# Patient Record
Sex: Male | Born: 2000 | Race: White | Hispanic: No | Marital: Single | Smoking: Never smoker
Health system: Southern US, Community
[De-identification: ages and names within clinical notes are randomized; demographics above are authoritative.]

## PROBLEM LIST (undated history)

## (undated) DIAGNOSIS — S3981XA Other specified injuries of abdomen, initial encounter: Secondary | ICD-10-CM

---
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
## 2022-01-25 ENCOUNTER — Encounter: Payer: Self-pay | Admitting: Emergency Medicine

## 2022-01-25 ENCOUNTER — Ambulatory Visit (INDEPENDENT_AMBULATORY_CARE_PROVIDER_SITE_OTHER): Payer: Medicaid Other

## 2022-01-25 ENCOUNTER — Ambulatory Visit
Admission: EM | Admit: 2022-01-25 | Discharge: 2022-01-25 | Disposition: A | Discharge status: Home / Self Care | Payer: Medicaid Other | Attending: Emergency Medicine | Admitting: Emergency Medicine

## 2022-01-25 DIAGNOSIS — S6991XA Unspecified injury of right wrist, hand and finger(s), initial encounter: Secondary | ICD-10-CM

## 2022-01-25 DIAGNOSIS — M25531 Pain in right wrist: Secondary | ICD-10-CM | POA: Diagnosis not present

## 2022-01-25 NOTE — ED Provider Notes (Signed)
Gabriel Sawyer    CSN: [id number] Arrival date & time: 01/25/22  0913      History   Chief Complaint Chief Complaint  Patient presents with   Wrist Injury    HPI Gabriel Sawyer is a 21 y.o. male.  Patient presents with right wrist pain x3 days.  The pain started after he fell while playing basketball and someone landed on his right wrist.  The pain is worse with movement.  Patient has treated this at home with ice packs and ibuprofen.  No OTC medications taken today.  The history is provided by the patient.   History reviewed. No pertinent past medical history.  Patient Active Problem List   Diagnosis Date Noted   Closed head injury with brief loss of consciousness (HCC) 06/18/2018    History reviewed. No pertinent surgical history.     Home Medications    Prior to Admission medications   Medication Sig Start Date End Date Taking? Authorizing Provider  Multiple Vitamins-Minerals (ONE-A-DAY TEEN ADVANTAGE/HIM) TABS Take 1 tablet by mouth daily.   Yes [provider]    Family History History reviewed. No pertinent family history.  Social History Social History   Tobacco Use   Smoking status: Never   Smokeless tobacco: Never  Substance Use Topics   Alcohol use: Never   Drug use: Never     Allergies   Patient has no known allergies.   Review of Systems Review of Systems  Musculoskeletal:  Positive for arthralgias. Negative for joint swelling.  Skin:  Negative for color change, rash and wound.  Neurological:  Negative for weakness and numbness.  All other systems reviewed and are negative.   Physical Exam Triage Vital Signs ED Triage Vitals [01/25/22 0934]  Enc Vitals Group     BP      Pulse      Resp      Temp      Temp src      SpO2      Weight      Height      Head Circumference      Peak Flow      Pain Score 6     Pain Loc      Pain Edu?      Excl. in GC?    No data found.  Updated Vital Signs BP 137/75   Pulse  74   Temp 98.1 F (36.7 C)   Resp 18   SpO2 100%   Visual Acuity Right Eye Distance:   Left Eye Distance:   Bilateral Distance:    Right Eye Near:   Left Eye Near:    Bilateral Near:     Physical Exam Vitals and nursing note reviewed.  Constitutional:      General: He is not in acute distress.    Appearance: Normal appearance. He is well-developed. He is not ill-appearing.  HENT:     Mouth/Throat:     Mouth: Mucous membranes are moist.  Cardiovascular:     Rate and Rhythm: Normal rate and regular rhythm.  Pulmonary:     Effort: Pulmonary effort is normal. No respiratory distress.  Musculoskeletal:        General: Tenderness present. No swelling or deformity. Normal range of motion.       Hands:     Cervical back: Neck supple.  Skin:    General: Skin is warm and dry.     Capillary Refill: Capillary refill takes less than 2  seconds.     Findings: No bruising, erythema, lesion or rash.  Neurological:     General: No focal deficit present.     Mental Status: He is alert and oriented to person, place, and time.     Sensory: No sensory deficit.     Motor: No weakness.  Psychiatric:        Mood and Affect: Mood normal.        Behavior: Behavior normal.     UC Treatments / Results  Labs (all labs ordered are listed, but only abnormal results are displayed) Labs Reviewed - No data to display  EKG   Radiology DG Wrist Complete Right  Result Date: 01/25/2022 CLINICAL DATA:  Pain after trauma 3 days ago. EXAM: RIGHT WRIST - COMPLETE 3+ VIEW COMPARISON:  None Available. FINDINGS: There is no evidence of fracture or dislocation. There is no evidence of arthropathy or other focal bone abnormality. Soft tissues are unremarkable. IMPRESSION: Negative. Electronically Signed   By: Gerome Sam III M.D.   On: 01/25/2022 09:51    Procedures Procedures (including critical care time)  Medications Ordered in UC Medications - No data to display  Initial Impression /  Assessment and Plan / UC Course  I have reviewed the triage vital signs and the nursing notes.  Pertinent labs & imaging results that were available during my care of the patient were reviewed by me and considered in my medical decision making (see chart for details).   Right wrist pain.  Xray negative.  Treating with ibuprofen, rest, elevation, ice packs.  Instructed patient to follow up with Ortho if his symptoms are not improving.  Education provided on wrist pain.  Patient agrees to plan of care.    Final Clinical Impressions(s) / UC Diagnoses   Final diagnoses:  Right wrist pain     Discharge Instructions      Take ibuprofen as needed.  Rest and elevate your wrist.  Apply ice packs 2-3 times a day for up to 20 minutes each.  Follow up with an orthopedist if your symptoms are not improving.        ED Prescriptions   None    PDMP not reviewed this encounter.   Mickie Bail, NP 01/25/22 1005

## 2022-01-25 NOTE — Discharge Instructions (Addendum)
Take ibuprofen as needed.  Rest and elevate your wrist.  Apply ice packs 2-3 times a day for up to 20 minutes each.  Follow up with an orthopedist if your symptoms are not improving.

## 2022-01-25 NOTE — ED Triage Notes (Signed)
Patient c/o RT wrist injury x 3 days ago.   Patient endorses upon onset of symptoms " I was playing basketball when I fell to the ground and someone fell on top of my wrist".   Patient endorses swelling. Patient endorses difficulty with flexion of the RT wrist.   Patient has used ice with no relief of symptoms.

## 2022-04-08 ENCOUNTER — Encounter: Payer: Self-pay | Admitting: Emergency Medicine

## 2022-04-08 ENCOUNTER — Emergency Department: Payer: Medicaid Other

## 2022-04-08 ENCOUNTER — Emergency Department
Admission: EM | Admit: 2022-04-08 | Discharge: 2022-04-08 | Disposition: A | Discharge status: Home / Self Care | Payer: Medicaid Other | Attending: Emergency Medicine | Admitting: Emergency Medicine

## 2022-04-08 ENCOUNTER — Other Ambulatory Visit: Payer: Self-pay

## 2022-04-08 DIAGNOSIS — Y92310 Basketball court as the place of occurrence of the external cause: Secondary | ICD-10-CM | POA: Insufficient documentation

## 2022-04-08 DIAGNOSIS — S0990XA Unspecified injury of head, initial encounter: Secondary | ICD-10-CM | POA: Diagnosis not present

## 2022-04-08 DIAGNOSIS — W01198A Fall on same level from slipping, tripping and stumbling with subsequent striking against other object, initial encounter: Secondary | ICD-10-CM | POA: Diagnosis not present

## 2022-04-08 DIAGNOSIS — S060X0A Concussion without loss of consciousness, initial encounter: Secondary | ICD-10-CM | POA: Diagnosis not present

## 2022-04-08 DIAGNOSIS — Y9367 Activity, basketball: Secondary | ICD-10-CM | POA: Insufficient documentation

## 2022-04-08 NOTE — Discharge Instructions (Addendum)
No work until after Wednesday  Ideally, minimal heavy exertion/physical activity until cleared by your trainer after returning to school  You can take tylenol and advil as needed for pain

## 2022-04-08 NOTE — ED Triage Notes (Signed)
Fell on back Saturday and injured head.  No loc.  Mom concerned about concussion syndrome.  Pt is sensitive to sounds, etc still.

## 2022-04-08 NOTE — ED Provider Notes (Signed)
[facility] Provider Note    Event Date/Time   First MD Initiated Contact with Patient 04/08/22 [phone]     (approximate)   History   Head Injury   HPI  Gabriel Sawyer is a 21 y.o. male here with head injury.  On Saturday, the patient was exercising, when he tried to jump on a an exercise platform.  He fell backwards, over 50 inches in height.  He struck the back of his head.  Reports that he does not believe he lost consciousness but he was "dazed."  He states that he had moderate headache throughout the rest today but did try to go and play basketball, which he noticed that his coordination was off and he actually struck missed a catch and was hit in the head.  Since then, the patient has had persistent headache, some light sensitivity, and noise sensitivity.  He tried to go back to work today and had significant worsening of symptoms so he presents for evaluation.  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  He has a history of 1 prior concussion in the past.  He is not on blood thinners.  He is otherwise healthy.  Denies any vomiting.  No nausea.  No confusion.     Physical Exam   Triage Vital Signs: ED Triage Vitals  Enc Vitals Group     BP 04/08/22 0920 (!) 102/58     Pulse Rate 04/08/22 0920 (!) 52     Resp 04/08/22 0920 17     Temp 04/08/22 0920 98 F (36.7 C)     Temp Source 04/08/22 0920 Oral     SpO2 04/08/22 0920 100 %     Weight 04/08/22 0921 165 lb (74.8 kg)     Height 04/08/22 0921 5\' 10"  (1.778 m)     Head Circumference --      Peak Flow --      Pain Score 04/08/22 0917 4     Pain Loc --      Pain Edu? --      Excl. in GC? --     Most recent vital signs: Vitals:   04/08/22 0920  BP: (!) 102/58  Pulse: (!) 52  Resp: 17  Temp: 98 F (36.7 C)  SpO2: 100%     General: Awake, no distress.  CV:  Good peripheral perfusion.  Regular rate and rhythm. Resp:  Normal effort.  No tenderness. Abd:  No  distention.  Other:  Mild bruising noted to the left eye from a secondary trauma.  No other periorbital ecchymoses.  No postauricular ecchymoses.  Cranial nerves II through XII individually tested and intact.  He is alert and oriented x4.  Strength out of 5 bilateral upper and lower extremities.  Normal sensation to light touch.  Gait normal.  No dysmetria.   ED Results / Procedures / Treatments   Labs (all labs ordered are listed, but only abnormal results are displayed) Labs Reviewed - No data to display   EKG    RADIOLOGY CT head/spine:   I also independently reviewed and agree with radiologist interpretations.   PROCEDURES:  Critical Care performed: No   MEDICATIONS ORDERED IN ED: Medications - No data to display   IMPRESSION / MDM / ASSESSMENT AND PLAN / ED COURSE  I reviewed the triage vital signs and the nursing notes.  The patient is on the cardiac monitor to evaluate for evidence of arrhythmia and/or significant heart rate changes.   Ddx:  Differential includes the following, with pertinent life- or limb-threatening emergencies considered:  Concussion, occult TBI such as skull fracture, subdural, epidural, subarachnoid, pulmonary contusion  Patient's presentation is most consistent with acute complicated illness / injury requiring diagnostic workup.  MDM:  Previously healthy 21 year old male here with persistent headache and light sensitivity after head trauma on Saturday.  Clinically, suspect moderate concussion.  He has a history of prior concussion.  He has no focal neurological deficits.  He is not on anticoagulants.  No family history of easy bruisi\mg or bleeding.  Given his persistent neurological symptoms, CT head obtained and is negative.  He also complained of neck pain so CT neck was obtained and is negative.  Has no upper extremity paresthesias or signs to suggest central cord or occult cord injury.  Will give  supportive care, advised him to stay away from work for now.  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  Return precautions given.   MEDICATIONS GIVEN IN ED: Medications - No data to display   Consults:     EMR reviewed       FINAL CLINICAL IMPRESSION(S) / ED DIAGNOSES   Final diagnoses:  Injury of head, initial encounter  Concussion without loss of consciousness, initial encounter     Rx / DC Orders   ED Discharge Orders     None        Note:  This document was prepared using Dragon voice recognition software and may include unintentional dictation errors.   Shaune Pollack, MD 04/08/22 [phone]

## 2022-04-08 NOTE — ED Notes (Signed)
See triage note  Presents with family s/p fall  hitting head   No LOC   States he has been sensitive to sounds and lights

## 2022-06-08 ENCOUNTER — Ambulatory Visit
Admission: EM | Admit: 2022-06-08 | Discharge: 2022-06-08 | Disposition: A | Discharge status: Home / Self Care | Payer: Medicaid Other | Attending: Urgent Care | Admitting: Urgent Care

## 2022-06-08 DIAGNOSIS — R338 Other retention of urine: Secondary | ICD-10-CM | POA: Diagnosis not present

## 2022-06-08 DIAGNOSIS — N342 Other urethritis: Secondary | ICD-10-CM | POA: Diagnosis not present

## 2022-06-08 HISTORY — DX: Other specified injuries of abdomen, initial encounter: S39.81XA

## 2022-06-08 MED ORDER — DOXYCYCLINE HYCLATE 100 MG PO CAPS: 100.0000 mg | ORAL_CAPSULE | Freq: Two times a day (BID) | ORAL | 0 refills | Status: AC

## 2022-06-08 NOTE — ED Provider Notes (Signed)
Renaldo Fiddler    CSN: [id number] Arrival date & time: 06/08/22  0825      History   Chief Complaint Chief Complaint  Patient presents with   Urinary Retention    HPI Gabriel Sawyer is a 21 y.o. male.   HPI  Patient presents to urgent care with multiple issues:  1.  He reports "urinary retention".  Says his urine flow is abnormally slow.  He believes he is emptying his bladder completely but states he has to go again after a few minutes.  Symptoms x1 month.  2.  He reports episodic erectile dysfunction.  3.  He describes an lesion on the head of his penis that is abnormal.  This was noted by his sexual partner (male).  He is unsure when this first appeared but it was first noticed about 1 month ago.  Endorses past history of "sports hernia" diagnosis in his right groin.]  Endorses 1 sexual partner, monogamous, and no risk of STD.  Uses condoms but not every time.  Denies any STD in his girlfriend.  Denies penile discharge.  Denies burning with urination.  Denies testicle pain.  Past Medical History:  Diagnosis Date   Sports hernia     Patient Active Problem List   Diagnosis Date Noted   Closed head injury with brief loss of consciousness (HCC) 06/18/2018    History reviewed. No pertinent surgical history.     Home Medications    Prior to Admission medications   Medication Sig Start Date End Date Taking? Authorizing Provider  Multiple Vitamins-Minerals (ONE-A-DAY TEEN ADVANTAGE/HIM) TABS Take 1 tablet by mouth daily.    [provider]    Family History History reviewed. No pertinent family history.  Social History Social History   Tobacco Use   Smoking status: Never   Smokeless tobacco: Never  Vaping Use   Vaping Use: Never used  Substance Use Topics   Alcohol use: Never   Drug use: Never     Allergies   Patient has no known allergies.   Review of Systems Review of Systems   Physical Exam Triage Vital Signs ED  Triage Vitals  Enc Vitals Group     BP 06/08/22 0851 119/64     Pulse Rate 06/08/22 0851 62     Resp 06/08/22 0851 18     Temp 06/08/22 0851 98.4 F (36.9 C)     Temp src --      SpO2 06/08/22 0851 98 %     Weight --      Height --      Head Circumference --      Peak Flow --      Pain Score 06/08/22 0852 0     Pain Loc --      Pain Edu? --      Excl. in GC? --    No data found.  Updated Vital Signs BP 119/64   Pulse 62   Temp 98.4 F (36.9 C)   Resp 18   SpO2 98%   Visual Acuity Right Eye Distance:   Left Eye Distance:   Bilateral Distance:    Right Eye Near:   Left Eye Near:    Bilateral Near:     Physical Exam Vitals reviewed.  Constitutional:      Appearance: Normal appearance.  Genitourinary:   Skin:    General: Skin is warm and dry.  Neurological:     General: No focal deficit present.  Mental Status: He is alert and oriented to person, place, and time.  Psychiatric:        Mood and Affect: Mood normal.        Behavior: Behavior normal.      UC Treatments / Results  Labs (all labs ordered are listed, but only abnormal results are displayed) Labs Reviewed - No data to display  EKG   Radiology No results found.  Procedures Procedures (including critical care time)  Medications Ordered in UC Medications - No data to display  Initial Impression / Assessment and Plan / UC Course  I have reviewed the triage vital signs and the nursing notes.  Pertinent labs & imaging results that were available during my care of the patient were reviewed by me and considered in my medical decision making (see chart for details).   1.  Symptoms suggest urethritis and will treat with antibiotic for the most common cause.  Obtaining penile swab.  Recommending follow-up with urology if symptoms do not resolve with treatment. 2.  This is possibly related to #1.  Recommending follow-up with urology if symptoms do not resolve with treatment. 3.  Lesion  resembles lichen planus.  Recommend follow-up with dermatology.   Final Clinical Impressions(s) / UC Diagnoses   Final diagnoses:  None   Discharge Instructions   None    ED Prescriptions   None    PDMP not reviewed this encounter.   Rose Phi, [place] 06/08/22 [phone]

## 2022-06-08 NOTE — ED Triage Notes (Signed)
Pt states for the past month he has been experiencing urinary retention and erectile erectile dysfunction. Pt. Also c/o abnormal appearence on the head of his penis.  Pt. States he was previously diagnosed w/ a sports hernia in his right groin site last week.

## 2022-06-08 NOTE — Discharge Instructions (Addendum)
You are being treated today for inflammation of your urethra.  Please take the antibiotic twice a day for 1 week as directed.  Take it with food to avoid stomach irritation.  If your symptoms do not resolve, I recommend follow-up with a urologist.  The results of the swab we took today will be communicated to you by MyChart.  If positive, someone will contact you to discuss treatment.     Follow-up with dermatology regarding the skin lesion.

## 2022-06-10 LAB — CYTOLOGY, (ORAL, ANAL, URETHRAL) ANCILLARY ONLY
Chlamydia: NEGATIVE
Comment: NEGATIVE
Comment: NEGATIVE
Comment: NORMAL
Neisseria Gonorrhea: NEGATIVE
Trichomonas: NEGATIVE

## 2023-03-05 IMAGING — DX DG WRIST COMPLETE 3+V*R*
4 series · 4 of 4 positions shown · non-contrast
Comparison: None Available.

CLINICAL DATA: Pain after trauma 3 days ago.

EXAM:
RIGHT WRIST - COMPLETE 3+ VIEW

[wrist pa (1 of 2)]
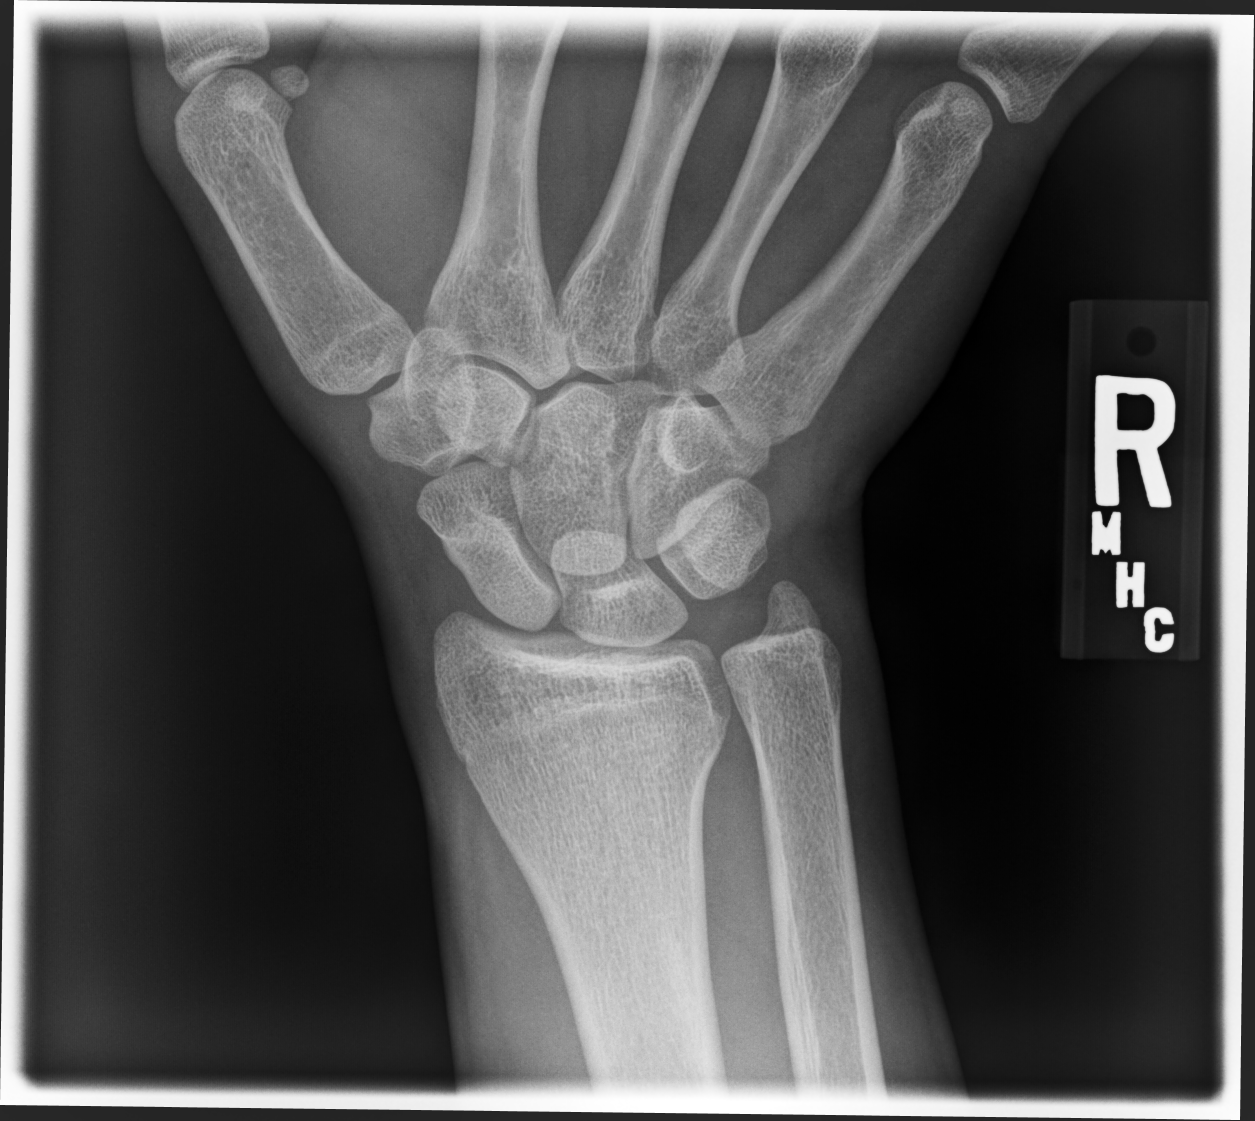

[wrist pa (2 of 2)]
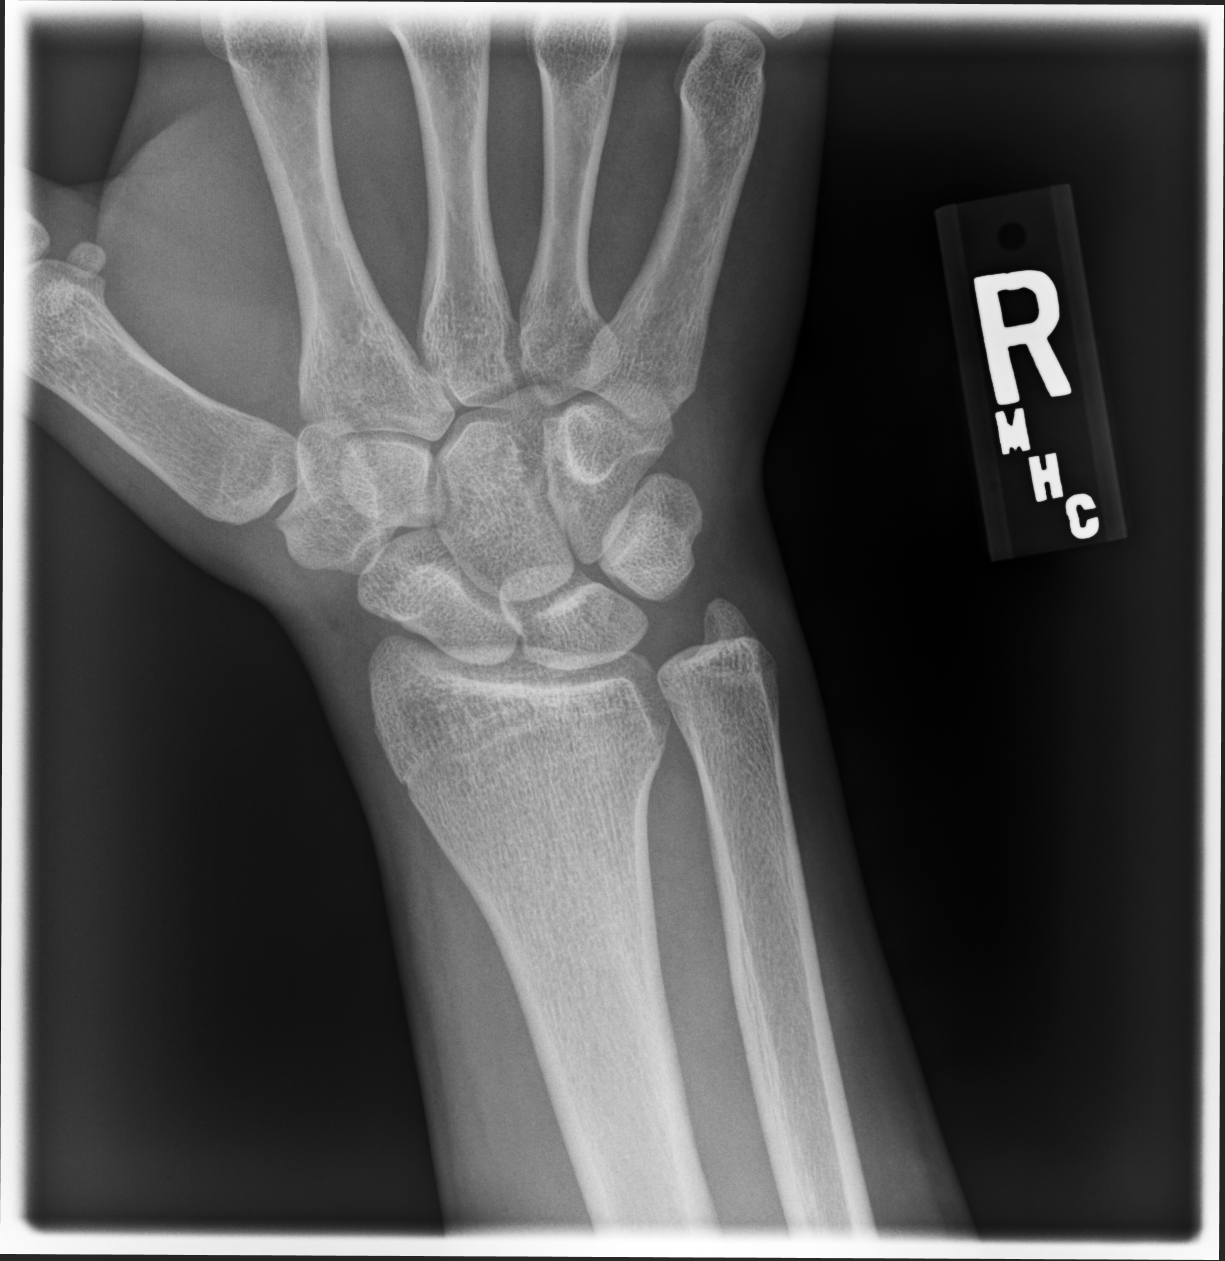

[wrist mlo]
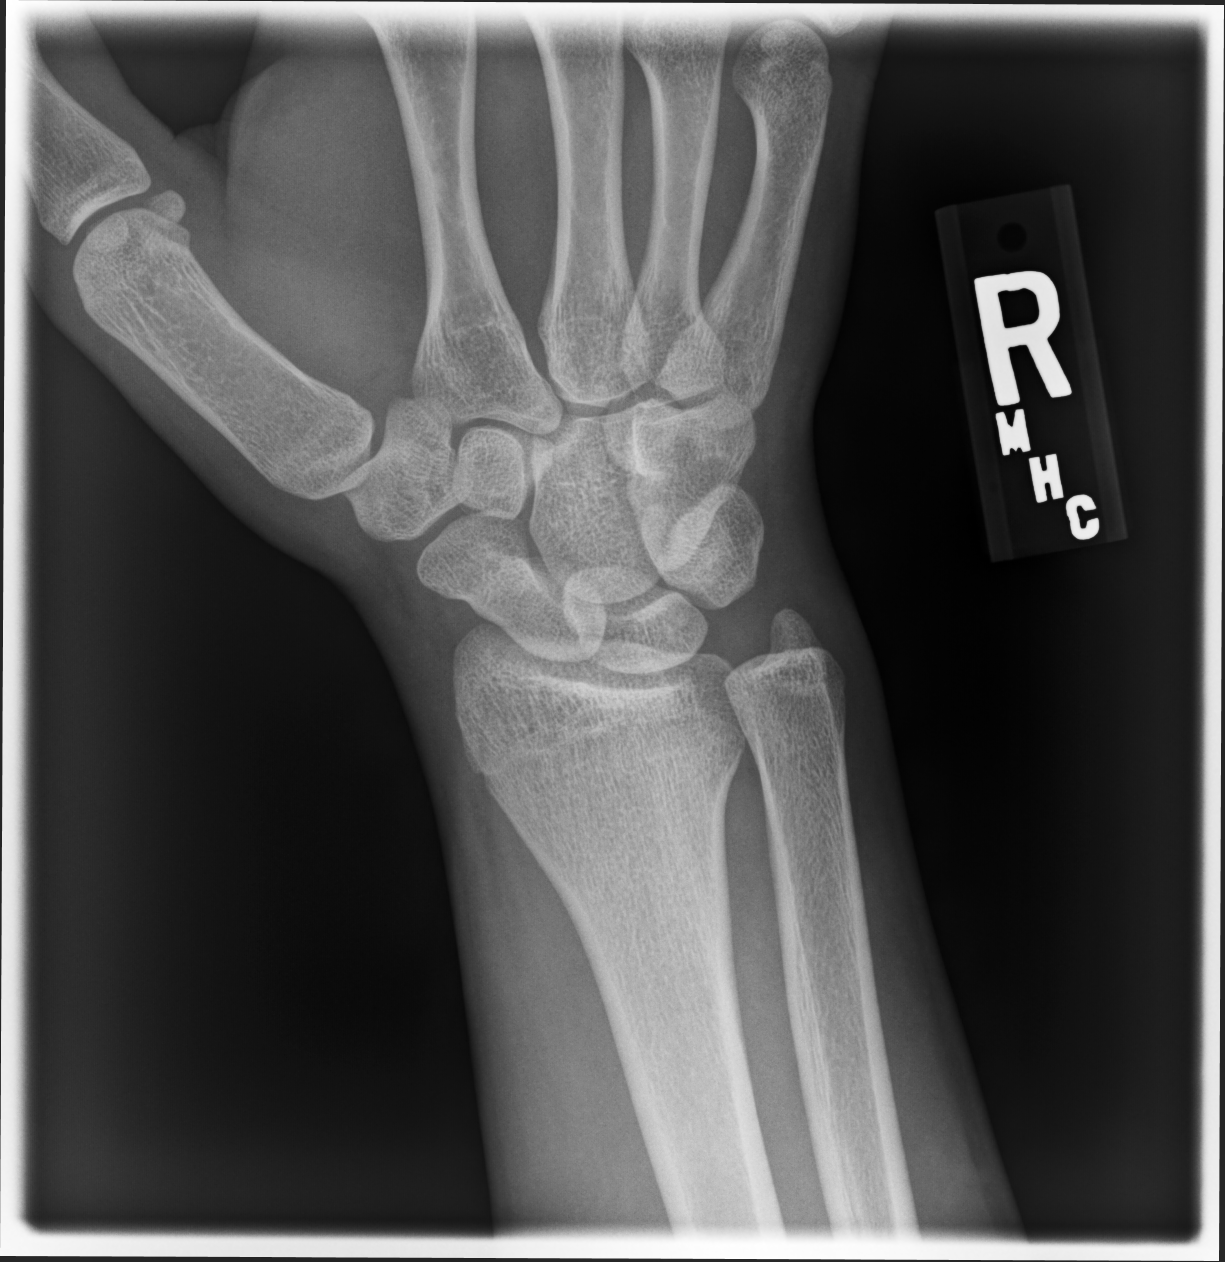

[wrist lat]
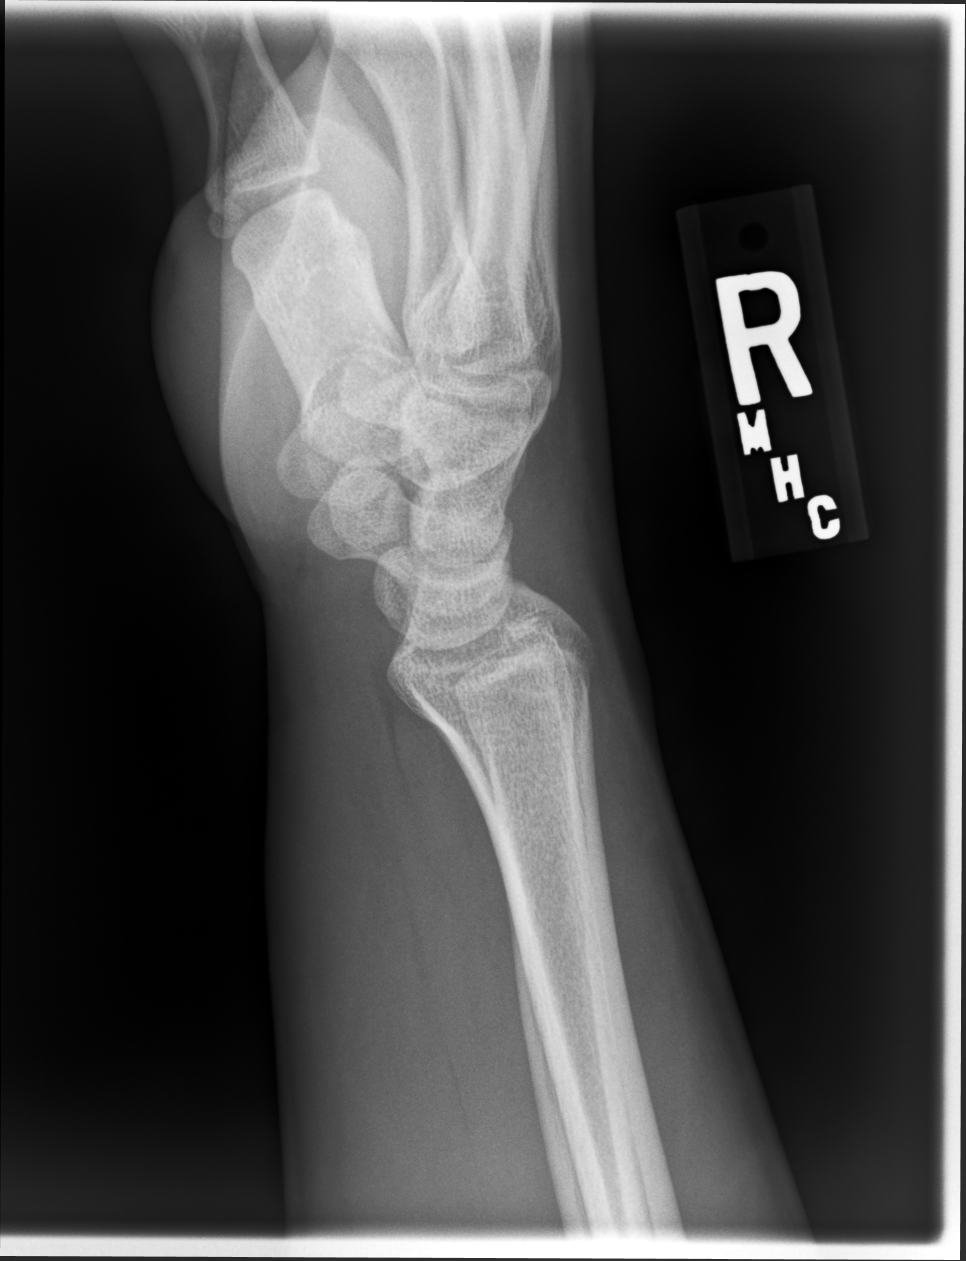

[4 of 4 positions shown; findings below may reference images not displayed]

FINDINGS: There is no evidence of fracture or dislocation. There is no
evidence of arthropathy or other focal bone abnormality. Soft
tissues are unremarkable.
IMPRESSION: Negative.

## 2023-08-05 ENCOUNTER — Encounter: Payer: Self-pay | Admitting: Dermatology

## 2023-08-05 ENCOUNTER — Ambulatory Visit: Payer: Medicaid Other | Admitting: Dermatology

## 2023-08-05 VITALS — BP 115/66 | HR 72

## 2023-08-05 DIAGNOSIS — D492 Neoplasm of unspecified behavior of bone, soft tissue, and skin: Secondary | ICD-10-CM | POA: Diagnosis not present

## 2023-08-05 NOTE — Patient Instructions (Signed)

## 2023-08-05 NOTE — Progress Notes (Addendum)
   New Patient Visit   Subjective  Gabriel Sawyer is a 22 y.o. male who presents for the following: spot on left side of foot Pt has a spot on his foot for years that has changed. There is family hx of abnormal moles but no personal hx.  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  The following portions of the chart were reviewed this encounter and updated as appropriate: medications, allergies, medical history  Review of Systems:  No other skin or systemic complaints except as noted in HPI or Assessment and Plan.  Objective  Well appearing patient in no apparent distress; mood and affect are within normal limits.   A focused examination was performed of the following areas: Side of left foot  Relevant exam findings are noted in the Assessment and Plan.  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  Return in about 3 months (around 11/05/2023) for spot check.  I, Tillie Fantasia, CMA, am acting as scribe for Gwenith Daily, MD.   Documentation: I have reviewed the above documentation for accuracy and completeness, and I agree with the above.  Gwenith Daily, MD

## 2023-09-12 ENCOUNTER — Emergency Department (HOSPITAL_COMMUNITY)
Admission: EM | Admit: 2023-09-12 | Discharge: 2023-09-12 | Disposition: A | Discharge status: Home / Self Care | Payer: Medicaid Other | Attending: Emergency Medicine | Admitting: Emergency Medicine

## 2023-09-12 ENCOUNTER — Other Ambulatory Visit: Payer: Self-pay

## 2023-09-12 ENCOUNTER — Emergency Department (HOSPITAL_COMMUNITY): Payer: Medicaid Other

## 2023-09-12 DIAGNOSIS — S3991XA Unspecified injury of abdomen, initial encounter: Secondary | ICD-10-CM | POA: Diagnosis present

## 2023-09-12 DIAGNOSIS — D72829 Elevated white blood cell count, unspecified: Secondary | ICD-10-CM | POA: Insufficient documentation

## 2023-09-12 DIAGNOSIS — R112 Nausea with vomiting, unspecified: Secondary | ICD-10-CM

## 2023-09-12 DIAGNOSIS — W208XXA Other cause of strike by thrown, projected or falling object, initial encounter: Secondary | ICD-10-CM | POA: Insufficient documentation

## 2023-09-12 LAB — COMPREHENSIVE METABOLIC PANEL
ALT: 20 U/L (ref 0–44)
AST: 37 U/L (ref 15–41)
Albumin: 4.5 g/dL (ref 3.5–5.0)
Alkaline Phosphatase: 52 U/L (ref 38–126)
Anion gap: 14 (ref 5–15)
BUN: 11 mg/dL (ref 6–20)
CO2: 19 mmol/L — ABNORMAL LOW (ref 22–32)
Calcium: 9.6 mg/dL (ref 8.9–10.3)
Chloride: 105 mmol/L (ref 98–111)
Creatinine, Ser: 1.05 mg/dL (ref 0.61–1.24)
GFR, Estimated: >60 mL/min (ref >60)
Glucose, Bld: 122 mg/dL — ABNORMAL HIGH (ref 70–99)
Potassium: 3.6 mmol/L (ref 3.5–5.1)
Sodium: 138 mmol/L (ref 135–145)
Total Bilirubin: 1.3 mg/dL — ABNORMAL HIGH (ref 0.0–1.2)
Total Protein: 7.4 g/dL (ref 6.5–8.1)

## 2023-09-12 LAB — CBC WITH DIFFERENTIAL/PLATELET
Abs Immature Granulocytes: 0.07 10*3/uL (ref 0.00–0.07)
Basophils Absolute: 0 10*3/uL (ref 0.0–0.1)
Basophils Relative: 0 %
Eosinophils Absolute: 0 10*3/uL (ref 0.0–0.5)
Eosinophils Relative: 0 %
HCT: 52.4 % — ABNORMAL HIGH (ref 39.0–52.0)
Hemoglobin: 17.6 g/dL — ABNORMAL HIGH (ref 13.0–17.0)
Immature Granulocytes: 0 %
Lymphocytes Relative: 2 %
Lymphs Abs: 0.4 10*3/uL — ABNORMAL LOW (ref 0.7–4.0)
MCH: 31.5 pg (ref 26.0–34.0)
MCHC: 33.6 g/dL (ref 30.0–36.0)
MCV: 93.9 fL (ref 80.0–100.0)
Monocytes Absolute: 1 10*3/uL (ref 0.1–1.0)
Monocytes Relative: 5 %
Neutro Abs: 16.7 10*3/uL — ABNORMAL HIGH (ref 1.7–7.7)
Neutrophils Relative %: 93 %
Platelets: 178 10*3/uL (ref 150–400)
RBC: 5.58 MIL/uL (ref 4.22–5.81)
RDW: 12.5 % (ref 11.5–15.5)
WBC: 18.2 10*3/uL — ABNORMAL HIGH (ref 4.0–10.5)
nRBC: 0 % (ref 0.0–0.2)

## 2023-09-12 LAB — LIPASE, BLOOD: Lipase: 28 U/L (ref 11–51)

## 2023-09-12 MED ORDER — MORPHINE SULFATE (PF) 4 MG/ML IV SOLN
4.0000 mg | Freq: Once | INTRAVENOUS | Status: AC
  Administered 2023-09-12: 4 mg via INTRAVENOUS
  Filled 2023-09-12: qty 1

## 2023-09-12 MED ORDER — ONDANSETRON HCL 4 MG PO TABS: 4.0000 mg | ORAL_TABLET | Freq: Four times a day (QID) | ORAL | 0 refills | Status: AC

## 2023-09-12 MED ORDER — SODIUM CHLORIDE 0.9 % IV BOLUS
1000.0000 mL | Freq: Once | INTRAVENOUS | Status: AC
  Administered 2023-09-12: 1000 mL via INTRAVENOUS

## 2023-09-12 MED ORDER — ONDANSETRON HCL 4 MG/2ML IJ SOLN
4.0000 mg | Freq: Once | INTRAMUSCULAR | Status: AC
Start: 2023-09-12 — End: 2023-09-12
  Administered 2023-09-12: 4 mg via INTRAVENOUS
  Filled 2023-09-12: qty 2

## 2023-09-12 MED ORDER — ONDANSETRON HCL 4 MG/2ML IJ SOLN
4.0000 mg | Freq: Once | INTRAMUSCULAR | Status: AC
  Administered 2023-09-12: 4 mg via INTRAVENOUS
  Filled 2023-09-12: qty 2

## 2023-09-12 MED ORDER — KETOROLAC TROMETHAMINE 30 MG/ML IJ SOLN
15.0000 mg | Freq: Once | INTRAMUSCULAR | Status: AC
  Administered 2023-09-12: 15 mg via INTRAVENOUS
  Filled 2023-09-12: qty 1

## 2023-09-12 MED ORDER — IOHEXOL 350 MG/ML SOLN
75.0000 mL | Freq: Once | INTRAVENOUS | Status: AC | PRN
  Administered 2023-09-12: 75 mL via INTRAVENOUS

## 2023-09-12 NOTE — ED Notes (Signed)
 Pt ambulatory to restroom with help of family member

## 2023-09-12 NOTE — ED Triage Notes (Signed)
 Patient via [facility] from home c/o dropping 135 lb weight on his abdomen @1300 . Pain and bruise noted around 1400 umbilicus that radaites to LUQ of abdomen. Reports n/v x 4 hours. Has urinated since this injury; no hematuria noted.  Vss.

## 2023-09-12 NOTE — ED Notes (Signed)
 Patient desatting to 76% s/p morphine - patient placed on 2L  and instructed to take deep breaths. Dr. Elayne Snare made aware.

## 2023-09-12 NOTE — ED Provider Notes (Signed)
 [facility] Provider Note   CSN: [id number] Arrival date & time: 09/12/23  1942     History  Chief Complaint  Patient presents with   Abdominal Injury    Gabriel Sawyer is a 23 y.o. male.  Who is otherwise healthy presenting for abdominal injury.  Patient was lifting weights (benchpress) when he accidentally dropped a 135 pound barbell across the middle of his abdomen.  He has had significant abdominal pain since then and vomited several times at home prior to arrival.  He has urinated since the injury without issue.  No other injuries or complaints this time.  HPI     Home Medications Prior to Admission medications   Medication Sig Start Date End Date Taking? Authorizing Provider  ondansetron  (ZOFRAN ) 4 MG tablet Take 1 tablet (4 mg total) by mouth every 6 (six) hours. 09/12/23  Yes Pamella Ozell LABOR, DO  Multiple Vitamins-Minerals (ONE-A-DAY TEEN ADVANTAGE/HIM) TABS Take 1 tablet by mouth daily.    [provider]      Allergies    Patient has no known allergies.    Review of Systems   Review of Systems  Physical Exam Updated Vital Signs BP 122/76   Pulse 80   Temp 97.9 F (36.6 C) (Oral)   Resp 19   SpO2 100%  Physical Exam Vitals and nursing note reviewed.  HENT:     Head: Normocephalic and atraumatic.  Eyes:     Pupils: Pupils are equal, round, and reactive to light.  Cardiovascular:     Rate and Rhythm: Normal rate and regular rhythm.  Pulmonary:     Effort: Pulmonary effort is normal.     Breath sounds: Normal breath sounds.  Abdominal:     Palpations: Abdomen is soft.     Tenderness: There is abdominal tenderness.     Comments: Diffuse abdominal tenderness with guarding  Skin:    General: Skin is warm and dry.  Neurological:     Mental Status: He is alert.  Psychiatric:        Mood and Affect: Mood normal.     ED Results / Procedures / Treatments   Labs (all labs ordered are listed, but only  abnormal results are displayed) Labs Reviewed  COMPREHENSIVE METABOLIC PANEL - Abnormal; Notable for the following components:      Result Value   CO2 19 (*)    Glucose, Bld 122 (*)    Total Bilirubin 1.3 (*)    All other components within normal limits  CBC WITH DIFFERENTIAL/PLATELET - Abnormal; Notable for the following components:   WBC 18.2 (*)    Hemoglobin 17.6 (*)    HCT 52.4 (*)    Neutro Abs 16.7 (*)    Lymphs Abs 0.4 (*)    All other components within normal limits  LIPASE, BLOOD    EKG None  Radiology CT ABDOMEN PELVIS W CONTRAST Result Date: 09/12/2023 CLINICAL DATA:  Abdominal trauma EXAM: CT ABDOMEN AND PELVIS WITH CONTRAST TECHNIQUE: Multidetector CT imaging of the abdomen and pelvis was performed using the standard protocol following bolus administration of intravenous contrast. RADIATION DOSE REDUCTION: This exam was performed according to the departmental dose-optimization program which includes automated exposure control, adjustment of the mA and/or kV according to patient size and/or use of iterative reconstruction technique. CONTRAST:  75mL OMNIPAQUE  IOHEXOL  350 MG/ML SOLN COMPARISON:  None Available. FINDINGS: Lower chest: No acute abnormality. Hepatobiliary: No hepatic injury or perihepatic hematoma. Gallbladder is  unremarkable. Pancreas: Unremarkable. No pancreatic ductal dilatation or surrounding inflammatory changes. Spleen: Normal in size without focal abnormality. Adrenals/Urinary Tract: Adrenal glands are unremarkable. Kidneys are normal, without renal calculi, focal lesion, or hydronephrosis. Bladder is unremarkable. Stomach/Bowel: Stomach is within normal limits. Appendix appears normal. No evidence of bowel wall thickening, distention, or inflammatory changes. There is diffuse fluid distention of small bowel and large bowel. Vascular/Lymphatic: No significant vascular findings are present. No enlarged abdominal or pelvic lymph nodes. Reproductive: Prostate is  unremarkable. Other: No abdominal wall hernia or abnormality. No abdominopelvic ascites. Musculoskeletal: No fracture is seen. IMPRESSION: 1. No CT evidence for acute traumatic injury in the abdomen or pelvis. 2. Diffuse fluid distention of small bowel and large bowel, nonspecific. Electronically Signed   By: Greig Pique M.D.   On: 09/12/2023 21:43    Procedures Ultrasound ED FAST  Date/Time: 09/12/2023 11:09 PM  Performed by: Pamella Ozell LABOR, DO Authorized by: Pamella Ozell LABOR, DO  Procedure details:    Indications: blunt abdominal trauma       Assess for:  Hemothorax and intra-abdominal fluid    Technique:  Abdominal and cardiac    Images: archived      Abdominal findings:    L kidney:  Visualized   R kidney:  Visualized   Liver:  Visualized    Bladder:  Visualized, Foley catheter not visualized   Hepatorenal space visualized: identified     Splenorenal space: identified     Rectovesical free fluid: not identified     Splenorenal free fluid: not identified     Hepatorenal space free fluid: not identified   Cardiac findings:    Heart:  Visualized   Wall motion: identified     Pericardial effusion: not identified   Comments:     Efast negative     Medications Ordered in ED Medications  sodium chloride  0.9 % bolus 1,000 mL (1,000 mLs Intravenous New Bag/Given 09/12/23 2033)  morphine  (PF) 4 MG/ML injection 4 mg (4 mg Intravenous Given 09/12/23 2014)  ondansetron  (ZOFRAN ) injection 4 mg (4 mg Intravenous Given 09/12/23 2014)  iohexol  (OMNIPAQUE ) 350 MG/ML injection 75 mL (75 mLs Intravenous Contrast Given 09/12/23 2025)  ondansetron  (ZOFRAN ) injection 4 mg (4 mg Intravenous Given 09/12/23 2243)  ketorolac  (TORADOL ) 30 MG/ML injection 15 mg (15 mg Intravenous Given 09/12/23 2242)    ED Course/ Medical Decision Making/ A&P Clinical Course as of 09/12/23 2315  Fri Sep 12, 2023  2309 No acute findings on imaging White count most likely explained due to repeated episodes of  vomiting Pain well-controlled here Patient able to eat and drink and has urinated and had a bowel movement here. Counseled him and his parents on symptomatic management for likely musculoskeletal injury Return precautions to be worrisome for severe abdominal injury were discussed in detail Will discharge with Zofran  prescription [MP]    Clinical Course User Index [MP] Pamella Ozell LABOR, DO                                 Medical Decision Making 23 year old male presenting for abdominal injury.  Dropped 135 pound barbell across his abdomen.  Nausea and vomiting since the injury.  Significant abdominal pain with diffuse tenderness on my exam.  Hemodynamically stable.  E-FAST is negative.  Will provide Zofran  and morphine  for pain control.  Obtain laboratory workup including CBC CMP and lipase along with CT abdomen pelvis to evaluate for intra-abdominal  injury.  Amount and/or Complexity of Data Reviewed Labs: ordered. Radiology: ordered.  Risk Prescription drug management.           Final Clinical Impression(s) / ED Diagnoses Final diagnoses:  Blunt trauma to abdomen, initial encounter  Nausea and vomiting, unspecified vomiting type    Rx / DC Orders ED Discharge Orders          Ordered    ondansetron  (ZOFRAN ) 4 MG tablet  Every 6 hours        09/12/23 2314              Pamella Ozell LABOR, DO 09/12/23 2315

## 2023-09-12 NOTE — Discharge Instructions (Signed)
 Gabriel Sawyer was seen in the emergency department for an abdominal injury The CAT scan did not show any evidence of injury His blood work showed an elevated white count but this is most likely in the setting of repeated vomiting He will need to be reevaluated by this primary doctor within the next week You should return for severe abdominal pain, if he is unable to eat or drink, has trouble with urination or for any other concerns We have called in Zofran  to your pharmacy for you to pick up and begin taking as directed for nausea and vomiting

## 2023-11-05 ENCOUNTER — Ambulatory Visit: Payer: Medicaid Other | Admitting: Dermatology

## 2023-12-04 ENCOUNTER — Encounter: Payer: Self-pay | Admitting: Dermatology

## 2023-12-04 ENCOUNTER — Ambulatory Visit: Payer: Medicaid Other | Admitting: Dermatology

## 2023-12-04 DIAGNOSIS — D492 Neoplasm of unspecified behavior of bone, soft tissue, and skin: Secondary | ICD-10-CM | POA: Diagnosis not present

## 2023-12-04 NOTE — Progress Notes (Signed)
   Follow-Up Visit   Subjective  Gabriel Sawyer is a 23 y.o. male who presents for the following: Follow up for a spot on left heel. Last seen 08/05/23 for the same lesion on his left heel. Lesion has not changed.  The following portions of the chart were reviewed this encounter and updated as appropriate: medications, allergies, medical history  Review of Systems:  No other skin or systemic complaints except as noted in HPI or Assessment and Plan.  Objective  Well appearing patient in no apparent distress; mood and affect are within normal limits.   A focused examination was performed of the following areas:  left heel  Relevant exam findings are noted in the Assessment and Plan.    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  I, Manual Meier, Surg Tech III, am acting as scribe for Gwenith Daily, MD.   Documentation: I have reviewed the above documentation for accuracy and completeness, and I agree with the above.  Gwenith Daily, MD
